# Patient Record
Sex: Male | Born: 1980 | Race: White | Hispanic: No | Marital: Married | Smoking: Current every day smoker
Health system: Southern US, Community
[De-identification: ages and names within clinical notes are randomized; demographics above are authoritative.]

## PROBLEM LIST (undated history)

## (undated) DIAGNOSIS — F419 Anxiety disorder, unspecified: Secondary | ICD-10-CM

## (undated) DIAGNOSIS — I10 Essential (primary) hypertension: Secondary | ICD-10-CM

## (undated) HISTORY — PX: KNEE SURGERY: SHX244

## (undated) HISTORY — DX: Essential (primary) hypertension: I10

## (undated) HISTORY — PX: SKIN GRAFT: SHX250

## (undated) HISTORY — DX: Anxiety disorder, unspecified: F41.9

---

## 1999-03-28 ENCOUNTER — Encounter: Payer: Self-pay | Admitting: Emergency Medicine

## 1999-03-28 ENCOUNTER — Emergency Department (HOSPITAL_COMMUNITY): Admission: EM | Admit: 1999-03-28 | Discharge: 1999-03-28 | Payer: Self-pay | Admitting: Emergency Medicine

## 2009-01-16 ENCOUNTER — Emergency Department (HOSPITAL_COMMUNITY): Admission: EM | Admit: 2009-01-16 | Discharge: 2009-01-16 | Payer: Self-pay | Admitting: Family Medicine

## 2010-12-14 LAB — COMPREHENSIVE METABOLIC PANEL
ALT: 56 U/L — ABNORMAL HIGH (ref 0–53)
AST: 35 U/L (ref 0–37)
Albumin: 4.2 g/dL (ref 3.5–5.2)
Alkaline Phosphatase: 119 U/L — ABNORMAL HIGH (ref 39–117)
BUN: 9 mg/dL (ref 6–23)
CO2: 28 mEq/L (ref 19–32)
Calcium: 9.8 mg/dL (ref 8.4–10.5)
Chloride: 100 mEq/L (ref 96–112)
Creatinine, Ser: 1.19 mg/dL (ref 0.4–1.5)
GFR calc Af Amer: >60 mL/min (ref >60)
GFR calc non Af Amer: >60 mL/min (ref >60)
Glucose, Bld: 94 mg/dL (ref 70–99)
Potassium: 3.4 mEq/L — ABNORMAL LOW (ref 3.5–5.1)
Sodium: 137 mEq/L (ref 135–145)
Total Bilirubin: 0.9 mg/dL (ref 0.3–1.2)
Total Protein: 7.4 g/dL (ref 6.0–8.3)

## 2010-12-14 LAB — POCT URINALYSIS DIP (DEVICE)
Glucose, UA: NEGATIVE mg/dL
Hgb urine dipstick: NEGATIVE
Ketones, ur: NEGATIVE mg/dL
Nitrite: NEGATIVE
Protein, ur: 100 mg/dL — AB
Specific Gravity, Urine: >=1.03 (ref 1.005–1.030)
Urobilinogen, UA: 1 mg/dL (ref 0.0–1.0)
pH: 5 (ref 5.0–8.0)

## 2010-12-14 LAB — CBC
HCT: 47.5 % (ref 39.0–52.0)
Hemoglobin: 16.7 g/dL (ref 13.0–17.0)
MCHC: 35.2 g/dL (ref 30.0–36.0)
MCV: 94.1 fL (ref 78.0–100.0)
Platelets: 184 10*3/uL (ref 150–400)
RBC: 5.05 MIL/uL (ref 4.22–5.81)
RDW: 12.7 % (ref 11.5–15.5)
WBC: 5.3 10*3/uL (ref 4.0–10.5)

## 2010-12-14 LAB — ROCKY MTN SPOTTED FVR AB, IGM-BLOOD: RMSF IgM: 0.08 IV (ref 0.00–0.89)

## 2010-12-14 LAB — DIFFERENTIAL
Basophils Absolute: 0 10*3/uL (ref 0.0–0.1)
Basophils Relative: 0 % (ref 0–1)
Eosinophils Absolute: 0.1 10*3/uL (ref 0.0–0.7)
Eosinophils Relative: 2 % (ref 0–5)
Lymphocytes Relative: 23 % (ref 12–46)
Lymphs Abs: 1.2 10*3/uL (ref 0.7–4.0)
Monocytes Absolute: 0.8 10*3/uL (ref 0.1–1.0)
Monocytes Relative: 15 % — ABNORMAL HIGH (ref 3–12)
Neutro Abs: 3.2 10*3/uL (ref 1.7–7.7)
Neutrophils Relative %: 60 % (ref 43–77)

## 2010-12-14 LAB — ROCKY MTN SPOTTED FVR AB, IGG-BLOOD: RMSF IgG: 0.08 IV

## 2011-04-06 ENCOUNTER — Emergency Department (HOSPITAL_COMMUNITY)
Admission: EM | Admit: 2011-04-06 | Discharge: 2011-04-06 | Disposition: A | Discharge status: Home / Self Care | Payer: 59 | Attending: Emergency Medicine | Admitting: Emergency Medicine

## 2011-04-06 ENCOUNTER — Emergency Department (HOSPITAL_COMMUNITY): Payer: 59

## 2011-04-06 DIAGNOSIS — E876 Hypokalemia: Secondary | ICD-10-CM | POA: Insufficient documentation

## 2011-04-06 DIAGNOSIS — F411 Generalized anxiety disorder: Secondary | ICD-10-CM | POA: Insufficient documentation

## 2011-04-06 DIAGNOSIS — IMO0001 Reserved for inherently not codable concepts without codable children: Secondary | ICD-10-CM | POA: Insufficient documentation

## 2011-04-06 DIAGNOSIS — I10 Essential (primary) hypertension: Secondary | ICD-10-CM | POA: Insufficient documentation

## 2011-04-06 DIAGNOSIS — M25519 Pain in unspecified shoulder: Secondary | ICD-10-CM | POA: Insufficient documentation

## 2011-04-06 DIAGNOSIS — R079 Chest pain, unspecified: Secondary | ICD-10-CM | POA: Insufficient documentation

## 2011-04-06 DIAGNOSIS — M546 Pain in thoracic spine: Secondary | ICD-10-CM | POA: Insufficient documentation

## 2011-04-06 LAB — CK TOTAL AND CKMB (NOT AT ARMC): Total CK: 301 U/L — ABNORMAL HIGH (ref 7–232)

## 2011-04-06 LAB — URINALYSIS, ROUTINE W REFLEX MICROSCOPIC
Bilirubin Urine: NEGATIVE
Glucose, UA: NEGATIVE mg/dL
Hgb urine dipstick: NEGATIVE
Ketones, ur: NEGATIVE mg/dL
Nitrite: NEGATIVE
Specific Gravity, Urine: 1.012 (ref 1.005–1.030)
pH: 6 (ref 5.0–8.0)

## 2011-04-06 LAB — COMPREHENSIVE METABOLIC PANEL
Albumin: 4.3 g/dL (ref 3.5–5.2)
Alkaline Phosphatase: 95 U/L (ref 39–117)
BUN: 11 mg/dL (ref 6–23)
CO2: 25 mEq/L (ref 19–32)
Chloride: 102 mEq/L (ref 96–112)
GFR calc non Af Amer: >60 mL/min (ref >60)
Potassium: 3.1 mEq/L — ABNORMAL LOW (ref 3.5–5.1)
Total Bilirubin: 0.3 mg/dL (ref 0.3–1.2)

## 2011-04-06 LAB — CBC
HCT: 42.5 % (ref 39.0–52.0)
Hemoglobin: 15.6 g/dL (ref 13.0–17.0)
MCH: 32.8 pg (ref 26.0–34.0)
MCV: 89.3 fL (ref 78.0–100.0)
Platelets: 201 10*3/uL (ref 150–400)
RBC: 4.76 MIL/uL (ref 4.22–5.81)
WBC: 6.9 10*3/uL (ref 4.0–10.5)

## 2011-04-06 LAB — DIFFERENTIAL
Lymphocytes Relative: 25 % (ref 12–46)
Lymphs Abs: 1.7 10*3/uL (ref 0.7–4.0)
Monocytes Relative: 5 % (ref 3–12)
Neutrophils Relative %: 68 % (ref 43–77)

## 2011-04-30 ENCOUNTER — Inpatient Hospital Stay (INDEPENDENT_AMBULATORY_CARE_PROVIDER_SITE_OTHER)
Admission: RE | Admit: 2011-04-30 | Discharge: 2011-04-30 | Disposition: A | Discharge status: Home / Self Care | Payer: 59 | Source: Ambulatory Visit | Attending: Emergency Medicine | Admitting: Emergency Medicine

## 2011-04-30 DIAGNOSIS — L723 Sebaceous cyst: Secondary | ICD-10-CM

## 2011-04-30 DIAGNOSIS — L02818 Cutaneous abscess of other sites: Secondary | ICD-10-CM

## 2011-05-03 LAB — CULTURE, ROUTINE-ABSCESS

## 2012-06-24 ENCOUNTER — Ambulatory Visit (INDEPENDENT_AMBULATORY_CARE_PROVIDER_SITE_OTHER): Payer: 59 | Admitting: Family Medicine

## 2012-06-24 VITALS — BP 132/78 | HR 130 | Temp 98.9°F | Resp 16 | Ht 69.0 in | Wt 204.0 lb

## 2012-06-24 DIAGNOSIS — H6690 Otitis media, unspecified, unspecified ear: Secondary | ICD-10-CM

## 2012-06-24 DIAGNOSIS — I10 Essential (primary) hypertension: Secondary | ICD-10-CM

## 2012-06-24 MED ORDER — NEOMYCIN-POLYMYXIN-HC 3.5-10000-1 OT SOLN: 3.0000 [drp] | Freq: Four times a day (QID) | OTIC | Status: AC

## 2012-06-24 MED ORDER — DOXYCYCLINE HYCLATE 100 MG PO TABS: 100.0000 mg | ORAL_TABLET | Freq: Two times a day (BID) | ORAL | Status: DC

## 2012-06-24 NOTE — Progress Notes (Signed)
3 days of fever, night sweats, feeling bad, dry cough and nasal discharge.  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

## 2012-06-24 NOTE — Patient Instructions (Signed)
Otitis Media with Effusion Otitis media with effusion is the presence of fluid in the middle ear. This is a common problem that often follows ear infections. It may be present for weeks or longer after the infection. Unlike an acute ear infection, otits media with effusion refers only to fluid behind the ear drum and not infection. Children with repeated ear and sinus infections and allergy problems are the most likely to get otitis media with effusion. CAUSES  The most frequent cause of the fluid buildup is dysfunction of the eustacian tubes. These are the tubes that drain fluid in the ears to the throat. SYMPTOMS   The main symptom of this condition is hearing loss. As a result, you or your child may:  Listen to the TV at a loud volume.  Not respond to questions.  Ask "what" often when spoken to.  There may be a sensation of fullness or pressure but usually not pain. DIAGNOSIS   Your caregiver will diagnose this condition by examining you or your child's ears.  Your caregiver may test the pressure in you or your child's ear with a tympanometer.  A hearing test may be conducted if the problem persists.  A caregiver will want to re-evaluate the condition periodically to see if it improves. TREATMENT   Treatment depends on the duration and the effects of the effusion.  Antibiotics, decongestants, nose drops, and cortisone-type drugs may not be helpful.  Children with persistent ear effusions may have delayed language. Children at risk for developmental delays in hearing, learning, and speech may require referral to a specialist earlier than children not at risk.  You or your child's caregiver may suggest a referral to an Ear, Nose, and Throat (ENT) surgeon for treatment. The following may help restore normal hearing:  Drainage of fluid.  Placement of ear tubes (tympanostomy tubes).  Removal of adenoids (adenoidectomy). HOME CARE INSTRUCTIONS   Avoid second hand  smoke.  Infants who are breast fed are less likely to have this condition.  Avoid feeding infants while laying flat.  Avoid known environmental allergens.  Be sure to see a caregiver or an ENT specialist for follow up.  Avoid people who are sick. SEEK MEDICAL CARE IF:   Hearing is not better in 3 months.  Hearing is worse.  Ear pain.  Drainage from the ear.  Dizziness. Document Released: 09/29/2004 Document Revised: 11/14/2011 Document Reviewed: 01/12/2010 ExitCare Patient Information 2013 [employer].  

## 2012-07-11 ENCOUNTER — Ambulatory Visit (INDEPENDENT_AMBULATORY_CARE_PROVIDER_SITE_OTHER): Payer: 59 | Admitting: Family Medicine

## 2012-07-11 ENCOUNTER — Telehealth: Payer: Self-pay

## 2012-07-11 VITALS — BP 162/100 | HR 124 | Temp 98.3°F | Resp 20 | Ht 69.5 in | Wt 199.8 lb

## 2012-07-11 DIAGNOSIS — B356 Tinea cruris: Secondary | ICD-10-CM

## 2012-07-11 DIAGNOSIS — H6090 Unspecified otitis externa, unspecified ear: Secondary | ICD-10-CM

## 2012-07-11 DIAGNOSIS — H60399 Other infective otitis externa, unspecified ear: Secondary | ICD-10-CM

## 2012-07-11 MED ORDER — KETOCONAZOLE 200 MG PO TABS: 200.0000 mg | ORAL_TABLET | Freq: Every day | ORAL | Status: AC

## 2012-07-11 MED ORDER — KETOCONAZOLE 2 % EX CREA: TOPICAL_CREAM | Freq: Every day | CUTANEOUS | Status: AC

## 2012-07-11 NOTE — Patient Instructions (Addendum)
Jock Itch Jock itch is a fungal infection of the skin in the groin area. It is sometimes called "ringworm" even though it is not caused by a worm. A fungus is a type of germ that thrives in dark, damp places.  CAUSES  This infection may spread from:  A fungus infection elsewhere on the body (such as athlete's foot).  Sharing towels or clothing. This infection is more common in:  Hot, humid climates.  People who wear tight-fitting clothing or wet bathing suits for long periods of time.  Athletes.  Overweight people.  People with diabetes. SYMPTOMS  Jock itch causes the following symptoms:  Red, pink or brown rash in the groin. Rash may spread to the thighs, anus, and buttocks.  Itching. DIAGNOSIS  Your caregiver may make the diagnosis by looking at the rash. Sometimes a skin scraping will be sent to test for fungus. Testing can be done either by looking under the microscope or by doing a culture (test to try to grow the fungus). A culture can take up to 2 weeks to come back. TREATMENT  Jock itch may be treated with:  Skin cream or ointment to kill fungus.  Medicine by mouth to kill fungus.  Skin cream or ointment to calm the itching.  Compresses or medicated powders to dry the infected skin. HOME CARE INSTRUCTIONS   Be sure to treat the rash completely. Follow your caregiver's instructions. It can take a couple of weeks to treat. If you do not treat the infection long enough, the rash can come back.  Wear loose-fitting clothing.  Men should wear cotton boxer shorts.  Women should wear cotton underwear.  Avoid hot baths.  Dry the groin area well after bathing. SEEK MEDICAL CARE IF:   Your rash is worse.  Your rash is spreading.  Your rash returns after treatment is finished.  Your rash is not gone in 4 weeks. Fungal infections are slow to respond to treatment. Some redness may remain for several weeks after the fungus is gone. SEEK IMMEDIATE MEDICAL CARE  IF:  The area becomes red, warm, tender, and swollen.  You have a fever. Document Released: 08/12/2002 Document Revised: 11/14/2011 Document Reviewed: 07/11/2008 ExitCare Patient Information 2013 [employer].  

## 2012-07-11 NOTE — Progress Notes (Signed)
31 yo man with bilateral groin rash, fissured skin with some swollen glands.  Married, has largely given up alcohol but still smokes some  Objective:  Lichenified groin rash with adjacent erythema, large lymph nodes in both inguinal creases. 1. Tinea cruris  ketoconazole (NIZORAL) 200 MG tablet, ketoconazole (NIZORAL) 2 % cream   Also, patient has been develop better source is years ago. He cannot hear normally and didn't have any tinnitus.  Objective: She scattered exudates in the left ear canal otherwise clear. TM is now normal bilaterally and he has no adenopathy in the neck.  Assessment resolving otitis externa with cortisporin  Plan: Continue the Cortisporin drops until gone

## 2012-07-11 NOTE — Telephone Encounter (Signed)
PT STATES HE WAS GIVEN MEDS TODAY WHICH HE GOOGLED AND SAYS ITS TOXIC TO HIS LIVER,WOULD LIKE ANOTHER RX EVEN IF MORE EXPENISVE THAT DOES  NOT HAVE LIVER TOXICITY.   BEST PHONE [phone]   [facility]

## 2012-07-11 NOTE — Telephone Encounter (Signed)
Being on the medication for 7 days typically does not cause problems - we worry about the liver when on medication for long time use.  Can use OTC cream if pt is still unsure about oral meds but it may not work as well.

## 2012-07-11 NOTE — Telephone Encounter (Signed)
patient notified and voiced understanding. He stated he was given rx for a cream and will try that first and if that is not working well then will use oral medciation

## 2012-07-28 ENCOUNTER — Ambulatory Visit (INDEPENDENT_AMBULATORY_CARE_PROVIDER_SITE_OTHER): Payer: 59 | Admitting: Family Medicine

## 2012-07-28 VITALS — BP 170/84 | HR 90 | Temp 98.1°F | Resp 18 | Ht 71.0 in | Wt 200.0 lb

## 2012-07-28 DIAGNOSIS — L0390 Cellulitis, unspecified: Secondary | ICD-10-CM

## 2012-07-28 DIAGNOSIS — L0291 Cutaneous abscess, unspecified: Secondary | ICD-10-CM

## 2012-07-28 DIAGNOSIS — H6690 Otitis media, unspecified, unspecified ear: Secondary | ICD-10-CM

## 2012-07-28 MED ORDER — DOXYCYCLINE HYCLATE 100 MG PO TABS: 100.0000 mg | ORAL_TABLET | Freq: Two times a day (BID) | ORAL | Status: DC

## 2012-07-28 NOTE — Patient Instructions (Addendum)
Take doxycycline 100 mg twice daily  Return as directed if needed to see Marylene Land PA on Monday 7:30-1

## 2012-07-28 NOTE — Progress Notes (Signed)
:   31 year old man with a history of some recurrences of an abscess area behind his right ear. He also had something in his right coracoid region not long ago. He's on doxycycline up until about a month ago. Recently he's been swelling behind his right ear getting more painful and tender.  Objective: Tender area just anterior to the mastoid. He has a tender node in the upper angle of the neck.  Assessment: Right retroauricular abscess  Plan: I&D and culture Doxycycline twice a day

## 2012-07-28 NOTE — Progress Notes (Signed)
  Subjective:    Patient ID: Kent Butler, male    DOB: 12/03/1980, 31 y.o.   MRN: [id number]  HPI    Review of Systems     Objective:   Physical Exam   Informed consent.  Patient placed in Left lateral decubitus position. Betadine prep behind R ear(pinna). 1cc plain 2 cc lidocaine injected locally.  #11 blade to make 1 cm incision. +purulent drainage and culture taken. 1 inch ear wick to pack incision site.  Bandaged.     Assessment & Plan:

## 2012-07-31 LAB — WOUND CULTURE: Gram Stain: NONE SEEN

## 2013-11-04 ENCOUNTER — Encounter: Payer: Self-pay | Admitting: General Surgery

## 2013-11-04 DIAGNOSIS — I10 Essential (primary) hypertension: Secondary | ICD-10-CM

## 2014-09-01 ENCOUNTER — Other Ambulatory Visit: Payer: Self-pay | Admitting: Orthopedic Surgery

## 2014-09-01 DIAGNOSIS — M25561 Pain in right knee: Secondary | ICD-10-CM

## 2014-09-03 ENCOUNTER — Ambulatory Visit
Admission: RE | Admit: 2014-09-03 | Discharge: 2014-09-03 | Disposition: A | Discharge status: Home / Self Care | Payer: BC Managed Care – PPO | Source: Ambulatory Visit | Attending: Orthopedic Surgery | Admitting: Orthopedic Surgery

## 2014-09-03 DIAGNOSIS — M25561 Pain in right knee: Secondary | ICD-10-CM

## 2016-12-01 ENCOUNTER — Emergency Department (HOSPITAL_COMMUNITY)
Admission: EM | Admit: 2016-12-01 | Discharge: 2016-12-02 | Disposition: A | Discharge status: Home / Self Care | Payer: BLUE CROSS/BLUE SHIELD | Attending: Emergency Medicine | Admitting: Emergency Medicine

## 2016-12-01 ENCOUNTER — Emergency Department (HOSPITAL_COMMUNITY): Payer: BLUE CROSS/BLUE SHIELD

## 2016-12-01 ENCOUNTER — Encounter (HOSPITAL_COMMUNITY): Payer: Self-pay | Admitting: *Deleted

## 2016-12-01 DIAGNOSIS — N50812 Left testicular pain: Secondary | ICD-10-CM | POA: Diagnosis not present

## 2016-12-01 DIAGNOSIS — F17210 Nicotine dependence, cigarettes, uncomplicated: Secondary | ICD-10-CM | POA: Insufficient documentation

## 2016-12-01 DIAGNOSIS — N50819 Testicular pain, unspecified: Secondary | ICD-10-CM

## 2016-12-01 DIAGNOSIS — I10 Essential (primary) hypertension: Secondary | ICD-10-CM | POA: Insufficient documentation

## 2016-12-01 DIAGNOSIS — N5089 Other specified disorders of the male genital organs: Secondary | ICD-10-CM

## 2016-12-01 NOTE — ED Triage Notes (Signed)
Pt complains of rash to groin area and painful lump on testicle.

## 2016-12-01 NOTE — ED Provider Notes (Signed)
[facility]-EMERGENCY DEPT Provider Note   CSN: [id number] Arrival date & time: 12/01/16  2014     History   Chief Complaint Chief Complaint  Patient presents with  . Testicle Pain    HPI Kent Butler is a 36 y.o. male who presents with 2 days of left sided testicular pain, redness and swelling. He also reports a small bump on left scrotum that initially started out as the size of a peanut but has grown to the size of a golf ball over the last two days. He reports that pain is worsened with movement. He has not had any medications. Patient reports a history of cysts and states that he was recently treated for one on his ear. He was started on Omnicef but stopped taking it yesterday because of symptoms. Patient denies any dysuria, hematuria, penile discharge, penile swelling or pain, fevers. He denies any trauma.   The history is provided by the patient.    Past Medical History:  Diagnosis Date  . Anxiety   . Hypertension     Patient Active Problem List   Diagnosis Date Noted  . Hypertension 06/24/2012    Past Surgical History:  Procedure Laterality Date  . KNEE SURGERY Right   . SKIN GRAFT     Rt knee       Home Medications    Prior to Admission medications   Medication Sig Start Date End Date Taking? Authorizing Provider  amLODipine (NORVASC) 10 MG tablet Take 10 mg by mouth daily.    Historical Provider, MD  cephALEXin (KEFLEX) 500 MG capsule Take 1 capsule (500 mg total) by mouth 4 (four) times daily. 12/02/16   Westley FootsLindsey Anne Jaquari Reckner, PA  doxycycline (VIBRAMYCIN) 100 MG capsule Take 1 capsule (100 mg total) by mouth 2 (two) times daily. 12/02/16   Westley FootsLindsey Anne Dwaine Pringle, PA  ketoconazole (NIZORAL) 2 % cream Apply topically daily. 07/11/12   Elvina SidleKurt Lauenstein, MD  ketoconazole (NIZORAL) 200 MG tablet Take 1 tablet (200 mg total) by mouth daily. 07/11/12   Elvina SidleKurt Lauenstein, MD  neomycin-polymyxin-hydrocortisone (CORTISPORIN) otic solution Place 3 drops into the right ear 4 (four)  times daily. 06/24/12   Elvina SidleKurt Lauenstein, MD  oxyCODONE-acetaminophen (PERCOCET/ROXICET) 5-325 MG tablet Take 2 tablets by mouth every 4 (four) hours as needed for severe pain. 12/02/16   Westley FootsLindsey Anne Farah Lepak, PA    Family History Family History  Problem Relation Age of Onset  . Heart disease Mother   . Diabetes Father   . Cancer Paternal Grandmother     Social History Social History  Substance Use Topics  . Smoking status: Current Every Day Smoker    Packs/day: 1.00    Years: 12.00    Types: Cigarettes  . Smokeless tobacco: Not on file  . Alcohol use 1.5 oz/week    3 Standard drinks or equivalent per week     Allergies   Atenolol   Review of Systems Review of Systems  Constitutional: Negative for fever.  Respiratory: Negative for shortness of breath.   Cardiovascular: Negative for chest pain.  Gastrointestinal: Negative for abdominal pain, nausea and vomiting.  Genitourinary: Positive for scrotal swelling and testicular pain. Negative for discharge, dysuria, hematuria, penile pain and penile swelling.  All other systems reviewed and are negative.    Physical Exam Updated Vital Signs BP (!) 172/98 (BP Location: Right Arm)   Pulse (!) 101   Temp 98.3 F (36.8 C) (Oral)   Resp 18   SpO2 100%   Physical Exam  Constitutional: He appears well-developed and well-nourished.  HENT:  Head: Normocephalic and atraumatic.  Eyes: Conjunctivae and EOM are normal. Right eye exhibits no discharge. Left eye exhibits no discharge. No scleral icterus.  Cardiovascular: Normal rate, regular rhythm and intact distal pulses.   Pulmonary/Chest: Effort normal.  Genitourinary: Penis normal.  Genitourinary Comments: GU exam done with Clydie Braun, PA-C present Erythema and swelling to left testicle. 3cm x 2cm left scrotal mass. No surrounding fluctuation.  Moderate tenderness to palpation of left testicle Right testicle normal.  Lichenified areas to bilateral inguinal folds  Musculoskeletal:  He exhibits no deformity.  Neurological: He is alert.  Skin: Skin is warm and dry. Capillary refill takes less than 2 seconds.  Psychiatric: He has a normal mood and affect. His speech is normal and behavior is normal.     ED Treatments / Results  Labs (all labs ordered are listed, but only abnormal results are displayed) Labs Reviewed - No data to display  EKG  EKG Interpretation None       Radiology US Scrotum  Result Date: 12/01/2016 CLINICAL DATA:  Painful LEFT testicular mass and rash for 2 days. EXAM: ULTRASOUND OF SCROTUM TECHNIQUE: Complete ultrasound examination of the testicles, epididymis, and other scrotal structures was performed. COMPARISON:  None. FINDINGS: Right testicle Measurements: 5.3 x 2.9 x 3.2 cm. No suspicious mass or microlithiasis visualized. 3 x 5 mm anechoic simple cyst with increased through transmission. Left testicle Measurements: 4.6 x 2.7 x 3.6 cm. No mass or microlithiasis visualized. Lateral to the LEFT testis within the scrotal soft tissues is a 2.9 x 1.8 x 2.2 cm complex hypoechoic avascular fluid irregular collection with increased through transmission. Right epididymis: Normal in size and appearance. 4 mm anechoic simple cyst. Left epididymis:  Normal in size and appearance. Hydrocele:  None visualized. Varicocele:  None visualized. IMPRESSION: 2.9 x 1.8 x 2.2 cm LEFT extra testicular scrotal fluid collection could represent hematoma or abscess. No acute testicular process. Small RIGHT testicle and RIGHT epididymal simple cysts. Electronically Signed   By: Awilda Metro M.D.   On: 12/01/2016 22:53   [place] Art/ven Flow Abd Pelv Doppler  Result Date: 12/01/2016 CLINICAL DATA:  Painful LEFT testicular mass and rash for 2 days. EXAM: ULTRASOUND OF SCROTUM TECHNIQUE: Complete ultrasound examination of the testicles, epididymis, and other scrotal structures was performed. COMPARISON:  None. FINDINGS: Right testicle Measurements: 5.3 x 2.9 x 3.2 cm. No  suspicious mass or microlithiasis visualized. 3 x 5 mm anechoic simple cyst with increased through transmission. Left testicle Measurements: 4.6 x 2.7 x 3.6 cm. No mass or microlithiasis visualized. Lateral to the LEFT testis within the scrotal soft tissues is a 2.9 x 1.8 x 2.2 cm complex hypoechoic avascular fluid irregular collection with increased through transmission. Right epididymis: Normal in size and appearance. 4 mm anechoic simple cyst. Left epididymis:  Normal in size and appearance. Hydrocele:  None visualized. Varicocele:  None visualized. IMPRESSION: 2.9 x 1.8 x 2.2 cm LEFT extra testicular scrotal fluid collection could represent hematoma or abscess. No acute testicular process. Small RIGHT testicle and RIGHT epididymal simple cysts. Electronically Signed   By: Awilda Metro M.D.   On: 12/01/2016 22:53    Procedures Procedures (including critical care time)  Medications Ordered in ED Medications - No data to display   Initial Impression / Assessment and Plan / ED Course  I have reviewed the triage vital signs and the nursing notes.  Pertinent labs & imaging results that were available during my  care of the patient were reviewed by me and considered in my medical decision making (see chart for details).    36 yo M presents with 2 days left sided scrotal pain, swelling and redness. Associated with a small cyst like structure that has grown in size over the last 2 days. No history of trauma. Physical exam with erythematous, edematous left testicle with a 3 cm circular mass. No fluctuance noted. Concern for scrotal abscess vs hydrocele vs epididymitis. Also consider testicular cancer vs testicular torsion, though low suspicion given history/physical. Will check ultrasound to evaluate.   Imaging reviewed. Ultrasound shows a 3 cm extra testicular swelling concerning for abscess vs hematoma. Will consult urology on call for evaluation of whether this needs inpatient or outpatient  treatment.   12:44 AM: Langston Masker, PA-C discussed with Dr. Sherron Monday, Urology on call. Recommends d/c patient on Keflex, Doxycylcine, and pain medications. Also recommends instructing patient to apply warm compresses and sitz bath. Instructed patient to call his office tomorrow to arrange to be seen either tomorrow or Monday.   Discussed plan with patient and family. Instructed him to take antibiotics as directed and to use warm compresses. Advised him that he can take ibuprofen for the pain as needed, but have also provided a short course of Percocet for pain relief. Instructed patient to call Dr. McDiarmid's office tomorrow to arrange for follow. Return precautions discussed patient reports understanding and agreement to plan.   Final Clinical Impressions(s) / ED Diagnoses   Final diagnoses:  Testicular pain    New Prescriptions New Prescriptions   CEPHALEXIN (KEFLEX) 500 MG CAPSULE    Take 1 capsule (500 mg total) by mouth 4 (four) times daily.   DOXYCYCLINE (VIBRAMYCIN) 100 MG CAPSULE    Take 1 capsule (100 mg total) by mouth 2 (two) times daily.   OXYCODONE-ACETAMINOPHEN (PERCOCET/ROXICET) 5-325 MG TABLET    Take 2 tablets by mouth every 4 (four) hours as needed for severe pain.     [address] [place], [place] 12/02/16 6295    Kent Loveless, MD 12/03/16 [phone]

## 2016-12-02 MED ORDER — DOXYCYCLINE HYCLATE 100 MG PO CAPS: 100.0000 mg | ORAL_CAPSULE | Freq: Two times a day (BID) | ORAL | 0 refills | Status: AC

## 2016-12-02 MED ORDER — OXYCODONE-ACETAMINOPHEN 5-325 MG PO TABS: 2.0000 | ORAL_TABLET | ORAL | 0 refills | Status: AC | PRN

## 2016-12-02 MED ORDER — CEPHALEXIN 500 MG PO CAPS: 500.0000 mg | ORAL_CAPSULE | Freq: Four times a day (QID) | ORAL | 0 refills | Status: AC

## 2016-12-02 NOTE — Discharge Instructions (Signed)
Apply warm compresses to the area.   Use Sitz baths as directed.   Take antibiotics as prescribed.   Call the referred urology doctors' office tomorrow morning to arrange for an appointment either tomorrow or Monday.  Return to the Emergency Dept if you have any fevers, worsening redness or swelling, worsening pain or any other concerning symptoms.

## 2016-12-02 NOTE — ED Provider Notes (Signed)
3cm swollen area left scrotum,   Ultrasound shows possible abscess vs hematoma.   I spoke with Urology Dr. Sherron Monday, he advised doxy, keflex and pain control,  Sitz baths or warm compresses.  Call office for follow up.  Return to ED if symptoms worsen    Elson Areas, PA-C 12/02/16 7425    Pricilla Loveless, MD 12/03/16 [phone]

## 2017-08-06 IMAGING — US US ART/VEN ABD/PELV/SCROTUM DOPPLER LTD
1 series · 14 of 25 positions shown · non-contrast
Comparison: None.

CLINICAL DATA: Painful LEFT testicular mass and rash for 2 days.

EXAM:
ULTRASOUND OF SCROTUM
TECHNIQUE: Complete ultrasound examination of the testicles, epididymis, and
other scrotal structures was performed.

[Series 1: us art/ven abd/pelv/scrotum doppler ltd · 0.07mm/px · 14 of 72 slices shown]
[im 1/72]
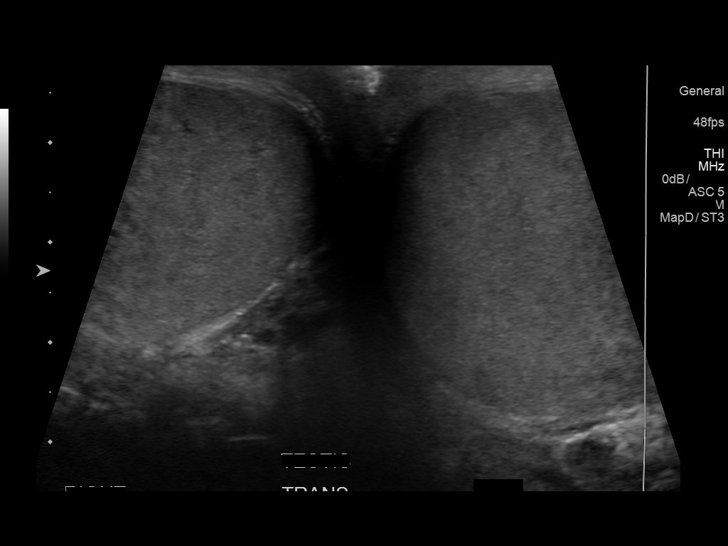
[im 6/72]
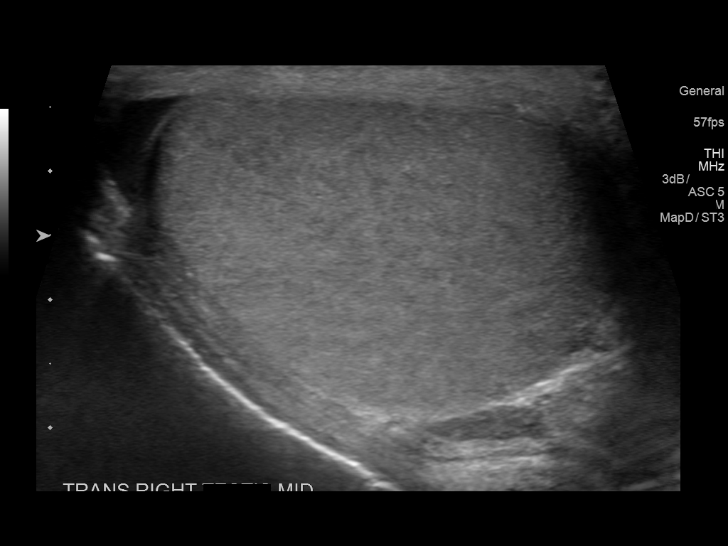
[im 12/72]
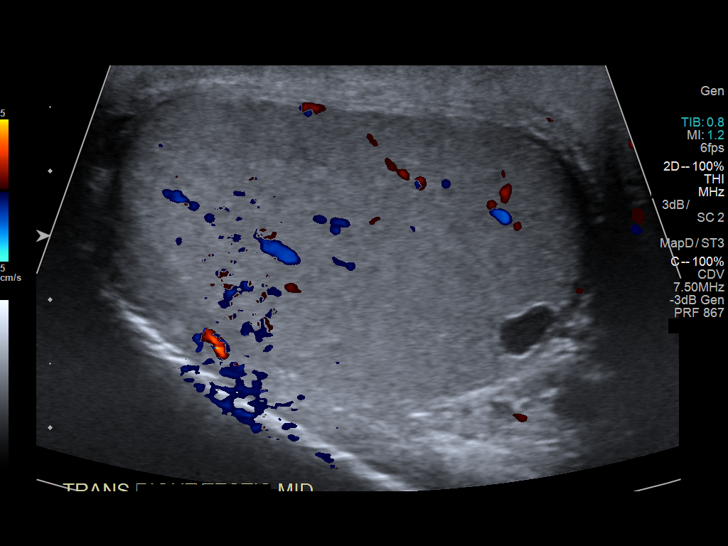
[im 18/72]
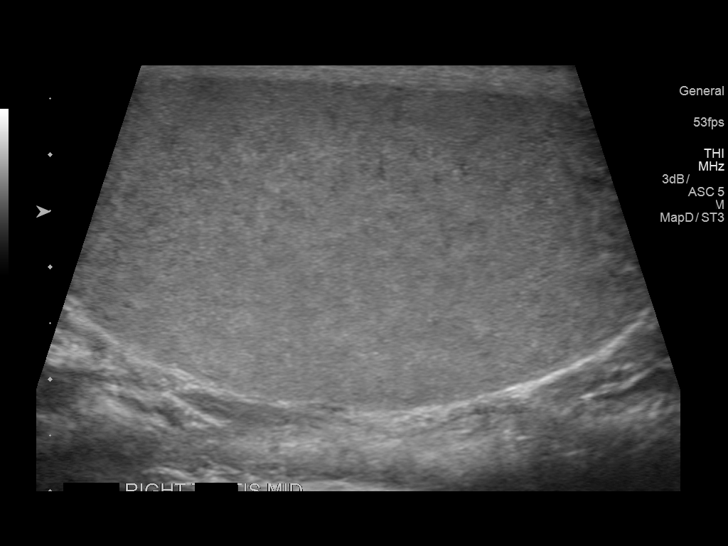
[im 24/72]
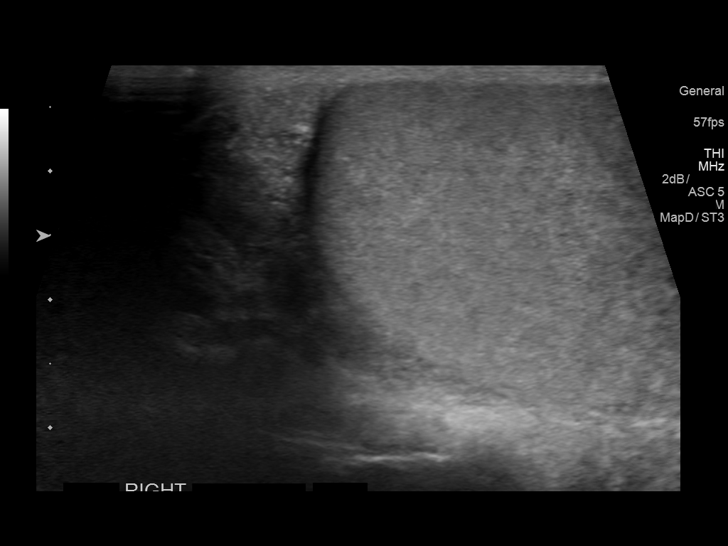
[im 27/72]
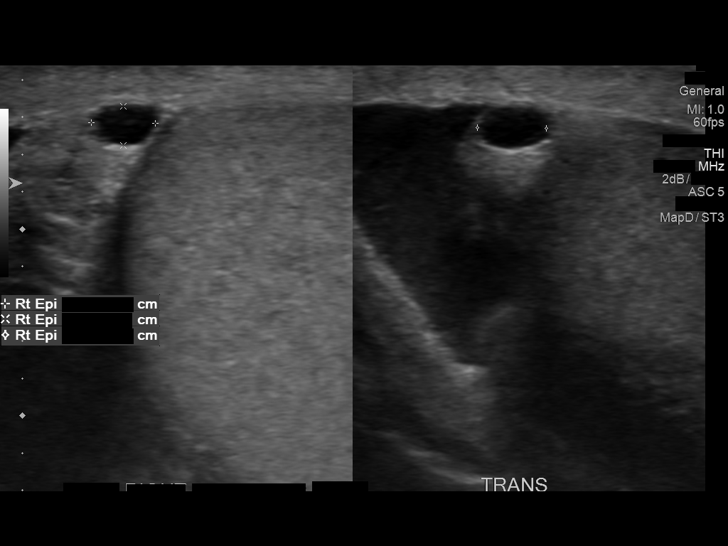
[im 33/72]
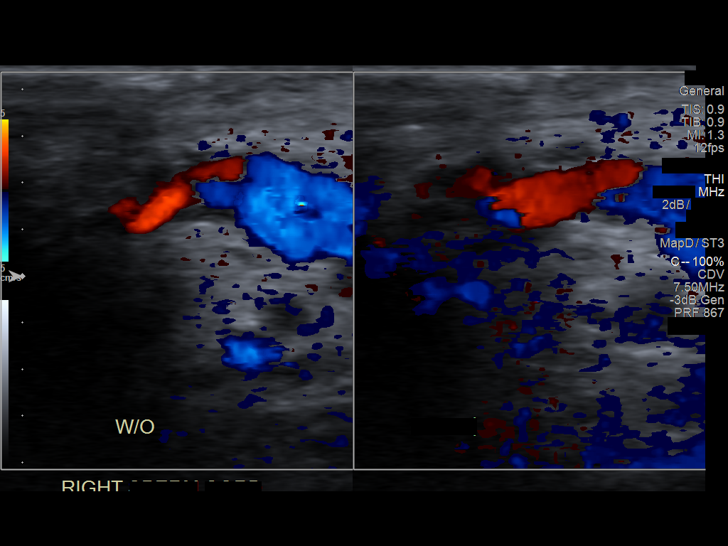
[im 39/72]
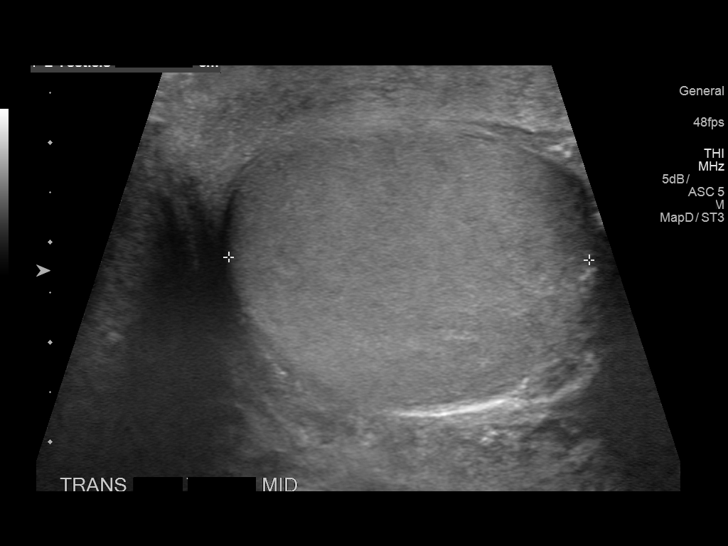
[im 45/72]
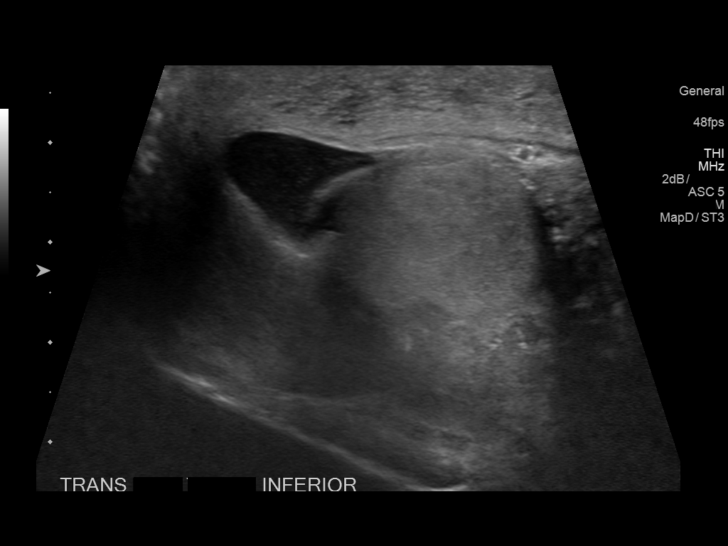
[im 48/72]
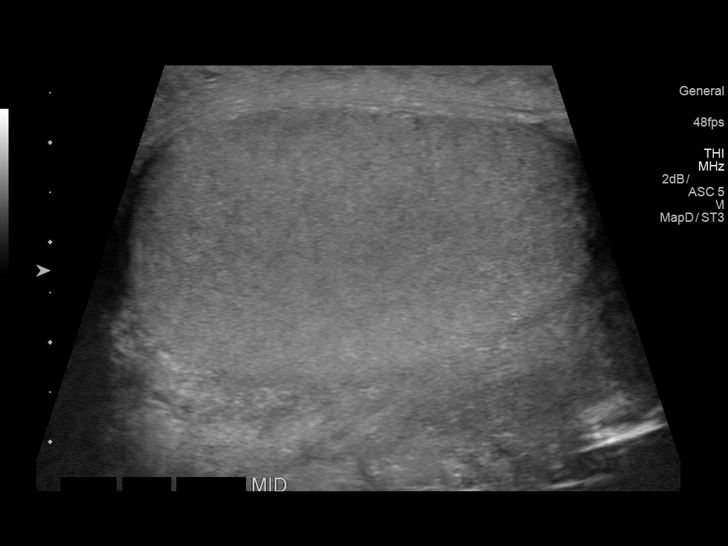
[im 54/72]
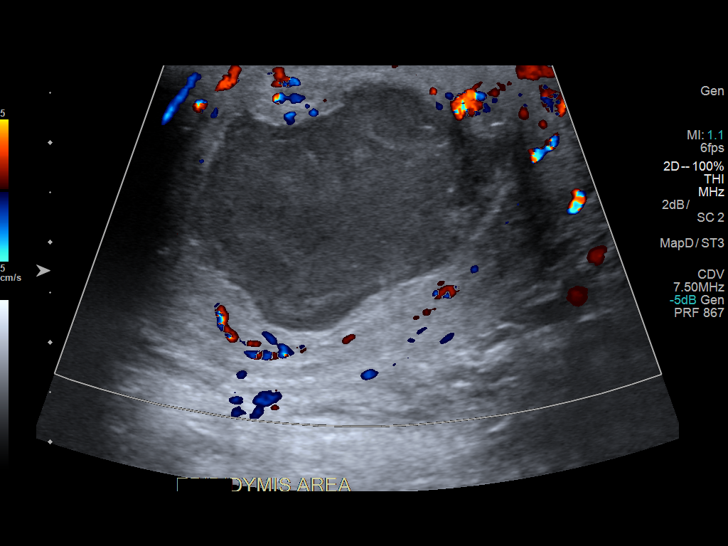
[im 60/72]
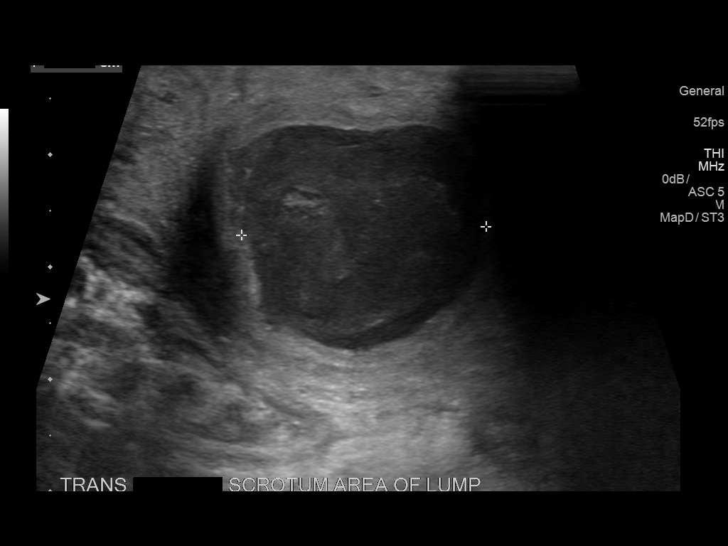
[im 66/72]
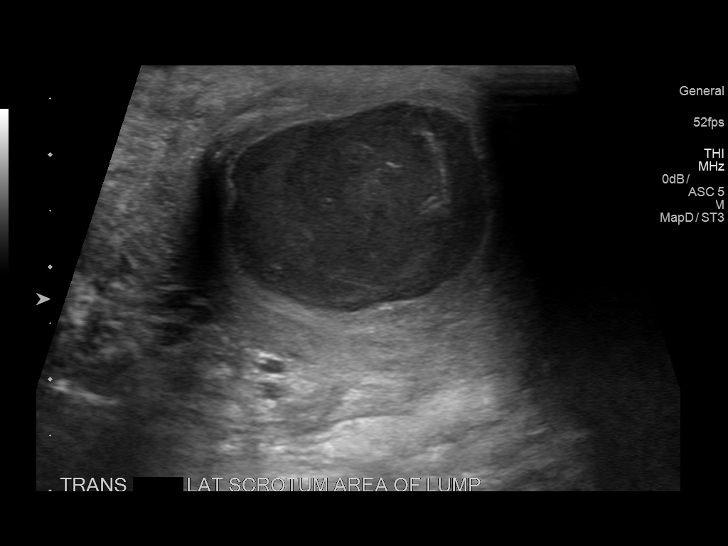
[im 72/72]
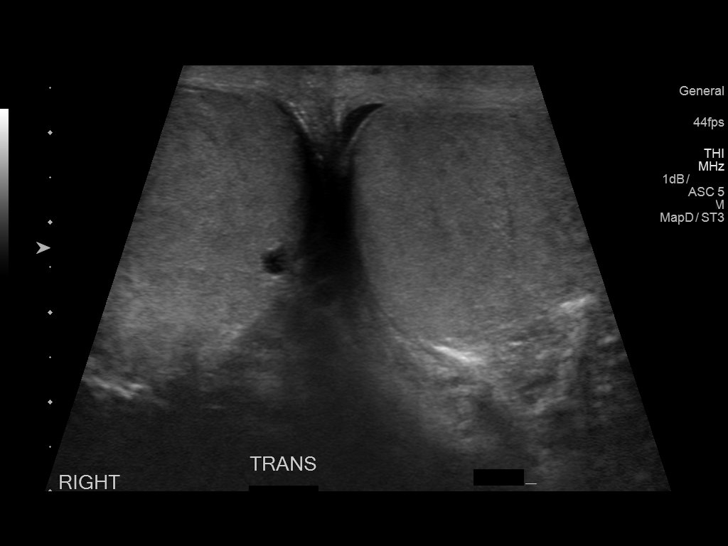

[14 of 25 positions shown; findings below may reference images not displayed]

FINDINGS: Right testicle

Measurements: 5.3 x 2.9 x 3.2 cm. No suspicious mass or
microlithiasis visualized. 3 x 5 mm anechoic simple cyst with
increased through transmission.

Left testicle

Measurements: 4.6 x 2.7 x 3.6 cm. No mass or microlithiasis
visualized. Lateral to the LEFT testis within the scrotal soft
tissues is a 2.9 x 1.8 x 2.2 cm complex hypoechoic avascular fluid
irregular collection with increased through transmission.

Right epididymis: Normal in size and appearance. 4 mm anechoic
simple cyst.

Left epididymis:  Normal in size and appearance.

Hydrocele:  None visualized.

Varicocele:  None visualized.
IMPRESSION: 2.9 x 1.8 x 2.2 cm LEFT extra testicular scrotal fluid collection
could represent hematoma or abscess.

No acute testicular process. Small RIGHT testicle and RIGHT
epididymal simple cysts.

## 2018-08-15 ENCOUNTER — Encounter (INDEPENDENT_AMBULATORY_CARE_PROVIDER_SITE_OTHER): Payer: Self-pay | Admitting: Family Medicine

## 2018-08-15 ENCOUNTER — Ambulatory Visit (INDEPENDENT_AMBULATORY_CARE_PROVIDER_SITE_OTHER): Payer: BLUE CROSS/BLUE SHIELD | Admitting: Family Medicine

## 2018-08-15 VITALS — BP 174/101 | HR 84 | Temp 97.1°F | Resp 16 | Ht 69.25 in | Wt 219.5 lb

## 2018-08-15 DIAGNOSIS — R202 Paresthesia of skin: Secondary | ICD-10-CM | POA: Diagnosis not present

## 2018-08-15 DIAGNOSIS — H9209 Otalgia, unspecified ear: Secondary | ICD-10-CM

## 2018-08-15 DIAGNOSIS — R200 Anesthesia of skin: Secondary | ICD-10-CM | POA: Diagnosis not present

## 2018-08-15 MED ORDER — AZITHROMYCIN 250 MG PO TABS: ORAL_TABLET | ORAL | 0 refills | Status: AC

## 2018-08-15 MED ORDER — METHYLPREDNISOLONE 4 MG PO TBPK: ORAL_TABLET | ORAL | 0 refills | Status: AC

## 2018-08-15 NOTE — Progress Notes (Signed)
Office Visit Note   Patient: Kent Butler           Date of Birth: 04/13/1981           MRN: [id number] Visit Date: 08/15/2018 Requested by: No referring provider defined for this encounter. PCP: Patient, No Pcp Per  Subjective: Chief Complaint  Patient presents with  . left ear pressure/pain, prod cough, nasal drainage    HPI: He is here with a couple concerns.  He has had pressure and pain in his left ear for the past couple weeks.  Head congestion in the mornings.  No fever or chills.  He states that he has had the same symptom pattern this time of year for the past several years, typically improving with antibiotics.  He feels like he needs one again.  He has had intermittent numbness and tingling in his right hand mainly the third finger for the past couple months.  He does a lot of strenuous physical activities as part of his work.               ROS: Hypertension has been well controlled.  Other systems were negative.  Objective: Vital Signs: BP (!) 174/101 (BP Location: Left Arm, Patient Position: Sitting, Cuff Size: Large)   Pulse 84   Temp (!) 97.1 F (36.2 C)   Resp 16   Ht 5' 9.25" (1.759 m)   Wt 219 lb 8 oz (99.6 kg)   BMI 32.18 kg/m   Physical Exam:  HEENT:  [place]/AT, PERRLA, EOM Full, no nystagmus.  Funduscopic examination within normal limits.  No conjunctival erythema.  Tympanic membranes are pearly gray with normal landmarks.  External ear canals are normal.  Nasal passages are clear.  Oropharynx is clear.  No significant lymphadenopathy.  No thyromegaly or nodules.  2+ carotid pulses without bruits. Lungs: Clear to auscultation throughout. CV:  RRR without MRG Right hand: Positive Tinel's carpal tunnel, positive Phalen's test.  No thenar atrophy.   Imaging: None today.  Assessment & Plan: 1.  Left ear pain with head congestion, etiology uncertain. -Due to duration of symptoms we will treat with a Medrol Dosepak.  Zithromax if not improving.  2.   Probable carpal tunnel syndrome right wrist -Night splint for the next 6 weeks.  Medrol Dosepak.  Cortisone injection if symptoms persist.   Follow-Up Instructions: No follow-ups on file.      Procedures: No procedures performed  No notes on file    PMFS History: Patient Active Problem List   Diagnosis Date Noted  . Hypertension 06/24/2012   Past Medical History:  Diagnosis Date  . Anxiety   . Hypertension     Family History  Problem Relation Age of Onset  . Heart disease Mother   . Diabetes Father   . Cancer Paternal Grandmother     Past Surgical History:  Procedure Laterality Date  . KNEE SURGERY Right   . SKIN GRAFT     Rt knee   Social History   Occupational History  . Not on file  Tobacco Use  . Smoking status: Current Every Day Smoker    Packs/day: 1.00    Years: 12.00    Pack years: 12.00    Types: Cigarettes  Substance and Sexual Activity  . Alcohol use: Yes    Alcohol/week: 3.0 standard drinks    Types: 3 Standard drinks or equivalent per week  . Drug use: No  . Sexual activity: Not on file
